# Patient Record
Sex: Male | Born: 1985 | Race: Black or African American | Hispanic: No | Marital: Single | Smoking: Never smoker
Health system: Southern US, Community
[De-identification: ages and names within clinical notes are randomized; demographics above are authoritative.]

## PROBLEM LIST (undated history)

## (undated) DIAGNOSIS — 419620001 Death: Secondary | SNOMED CT

## (undated) DEATH — deceased

---
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
## 2006-02-18 ENCOUNTER — Emergency Department (HOSPITAL_COMMUNITY): Admission: EM | Admit: 2006-02-18 | Discharge: 2006-02-19 | Payer: Self-pay | Admitting: Emergency Medicine

## 2006-02-19 ENCOUNTER — Emergency Department (HOSPITAL_COMMUNITY): Admission: EM | Admit: 2006-02-19 | Discharge: 2006-02-19 | Payer: Self-pay | Admitting: Family Medicine

## 2006-11-02 ENCOUNTER — Emergency Department (HOSPITAL_COMMUNITY): Admission: EM | Admit: 2006-11-02 | Discharge: 2006-11-02 | Payer: Self-pay | Admitting: Family Medicine

## 2007-09-19 ENCOUNTER — Emergency Department (HOSPITAL_COMMUNITY): Admission: EM | Admit: 2007-09-19 | Discharge: 2007-09-19 | Payer: Self-pay | Admitting: Emergency Medicine

## 2007-09-21 ENCOUNTER — Emergency Department (HOSPITAL_COMMUNITY): Admission: EM | Admit: 2007-09-21 | Discharge: 2007-09-21 | Payer: Self-pay | Admitting: Emergency Medicine

## 2007-09-23 ENCOUNTER — Encounter (INDEPENDENT_AMBULATORY_CARE_PROVIDER_SITE_OTHER): Payer: Self-pay | Admitting: Nurse Practitioner

## 2007-09-23 ENCOUNTER — Emergency Department (HOSPITAL_COMMUNITY): Admission: EM | Admit: 2007-09-23 | Discharge: 2007-09-23 | Payer: Self-pay | Admitting: Emergency Medicine

## 2007-09-28 ENCOUNTER — Ambulatory Visit: Payer: Self-pay | Admitting: Nurse Practitioner

## 2007-09-28 DIAGNOSIS — T22219A Burn of second degree of unspecified forearm, initial encounter: Secondary | ICD-10-CM | POA: Insufficient documentation

## 2007-10-01 ENCOUNTER — Ambulatory Visit: Payer: Self-pay | Admitting: Nurse Practitioner

## 2008-02-22 ENCOUNTER — Emergency Department (HOSPITAL_COMMUNITY): Admission: EM | Admit: 2008-02-22 | Discharge: 2008-02-22 | Payer: Self-pay | Admitting: Family Medicine

## 2009-05-06 ENCOUNTER — Emergency Department (HOSPITAL_COMMUNITY): Admission: EM | Admit: 2009-05-06 | Discharge: 2009-05-06 | Payer: Self-pay | Admitting: Emergency Medicine

## 2010-02-24 ENCOUNTER — Emergency Department (HOSPITAL_COMMUNITY): Admission: EM | Admit: 2010-02-24 | Discharge: 2010-02-24 | Payer: Self-pay | Admitting: Emergency Medicine

## 2013-04-26 ENCOUNTER — Emergency Department (HOSPITAL_COMMUNITY)
Admission: EM | Admit: 2013-04-26 | Discharge: 2013-04-26 | Disposition: A | Discharge status: Home / Self Care | Payer: Self-pay | Attending: Emergency Medicine | Admitting: Emergency Medicine

## 2013-04-26 ENCOUNTER — Encounter (HOSPITAL_COMMUNITY): Payer: Self-pay | Admitting: Nurse Practitioner

## 2013-04-26 DIAGNOSIS — M436 Torticollis: Secondary | ICD-10-CM | POA: Insufficient documentation

## 2013-04-26 MED ORDER — DIAZEPAM 5 MG PO TABS
5.0000 mg | ORAL_TABLET | Freq: Once | ORAL | Status: AC
  Administered 2013-04-26: 5 mg via ORAL
  Filled 2013-04-26: qty 1

## 2013-04-26 MED ORDER — IBUPROFEN 600 MG PO TABS: 600.0000 mg | ORAL_TABLET | Freq: Three times a day (TID) | ORAL | Status: DC

## 2013-04-26 MED ORDER — DIAZEPAM 5 MG PO TABS: 5.0000 mg | ORAL_TABLET | Freq: Two times a day (BID) | ORAL | Status: DC

## 2013-04-26 MED ORDER — TRAMADOL HCL 50 MG PO TABS: 50.0000 mg | ORAL_TABLET | Freq: Three times a day (TID) | ORAL | Status: DC | PRN

## 2013-04-26 NOTE — ED Notes (Signed)
Pt states he woke this am "with a  crick in my neck." states " i think i slept wrong." took ibuprofen with no relief. Denies any recent injuries. A&ox4, ambulatory

## 2013-04-26 NOTE — ED Provider Notes (Signed)
History  This chart was scribed for Shawn Munch, MD by Ardeen Jourdain, ED Scribe. This patient was seen in room TR11C/TR11C and the patient's care was started at 2028.  CSN: [id number]  Arrival date & time 04/26/13  1743   None     Chief Complaint  Patient presents with  . Neck Pain    The history is provided by the patient. No language interpreter was used.    CHANE COWDEN is a 27 y.o. male who presents to the Emergency Department complaining of gradual onset, gradually worsening, constant left neck pain that began this morning when he woke up. He states "I think I slept wrong." He states the pain is aggravated by moving his head. He denies any recent injuries or trauma to the area. He reports taking ibuprofen with no relief. He denies any fever, chills, SOB, difficulty swallowing, HA and CP as associated symptoms.    No past medical history on file.  No past surgical history on file.  No family history on file.  History  Substance Use Topics  . Smoking status: Never Smoker   . Smokeless tobacco: Not on file  . Alcohol Use: Yes      Review of Systems  Constitutional:       Per HPI, otherwise negative  HENT:       Per HPI, otherwise negative  Respiratory:       Per HPI, otherwise negative  Cardiovascular:       Per HPI, otherwise negative  Gastrointestinal: Negative for vomiting.  Endocrine:       Negative aside from HPI  Genitourinary:       Neg aside from HPI   Musculoskeletal:       Per HPI, otherwise negative  Skin: Negative.   Neurological: Negative for syncope.  All other systems reviewed and are negative.    Allergies  Review of patient's allergies indicates no known allergies.  Home Medications  No current outpatient prescriptions on file.  Triage Vitals: BP 144/85  Pulse 79  Temp(Src) 98.8 F (37.1 C) (Oral)  Resp 16  SpO2 100%  Physical Exam  Nursing note and vitals reviewed. Constitutional: He is oriented to person, place, and  time. He appears well-developed. No distress.  HENT:  Head: Normocephalic and atraumatic.  Eyes: Conjunctivae and EOM are normal.  Neck: Neck supple. No tracheal deviation present. No thyromegaly present.  TTP to left paraspinal muscles at the base of the neck  Cardiovascular: Normal rate, regular rhythm and normal heart sounds.  Exam reveals no gallop and no friction rub.   No murmur heard. Pulmonary/Chest: Effort normal and breath sounds normal. No stridor. No respiratory distress. He has no wheezes. He has no rales. He exhibits no tenderness.  Abdominal: He exhibits no distension.  Musculoskeletal: He exhibits no edema.  Lymphadenopathy:    He has no cervical adenopathy.  Neurological: He is alert and oriented to person, place, and time.  Skin: Skin is warm and dry.  Psychiatric: He has a normal mood and affect.    ED Course  Procedures (including critical care time)  8:33 PM-Discussed treatment plan which includes antiinflammatory medications, muscle relaxants and advise for home care with pt at bedside and pt agreed to plan.    Labs Reviewed - No data to display No results found.   No diagnosis found.    MDM   I personally performed the services described in this documentation, which was scribed in my presence. The recorded information has  been reviewed and is accurate.   Patient presents with the acute onset of neck pain 2 mornings ago.  On exam he is neurologically intact, and in no distress.  Given the atraumatic history, absence of distress, the patient was treated for torticollis discharge with return precautions, follow instructions   Shawn Munch, MD 04/26/13 2038

## 2013-06-22 ENCOUNTER — Encounter (HOSPITAL_COMMUNITY): Payer: Self-pay | Admitting: Emergency Medicine

## 2013-06-22 ENCOUNTER — Emergency Department (HOSPITAL_COMMUNITY)
Admission: EM | Admit: 2013-06-22 | Discharge: 2013-06-22 | Disposition: A | Discharge status: Home / Self Care | Payer: Self-pay | Attending: Emergency Medicine | Admitting: Emergency Medicine

## 2013-06-22 ENCOUNTER — Emergency Department (HOSPITAL_COMMUNITY): Payer: Self-pay

## 2013-06-22 DIAGNOSIS — R071 Chest pain on breathing: Secondary | ICD-10-CM | POA: Insufficient documentation

## 2013-06-22 DIAGNOSIS — R0789 Other chest pain: Secondary | ICD-10-CM

## 2013-06-22 MED ORDER — SIMETHICONE 40 MG/0.6ML PO SUSP (UNIT DOSE)
40.0000 mg | Freq: Once | ORAL | Status: AC
  Administered 2013-06-22: 40 mg via ORAL
  Filled 2013-06-22: qty 0.6

## 2013-06-22 MED ORDER — IBUPROFEN 800 MG PO TABS
800.0000 mg | ORAL_TABLET | Freq: Once | ORAL | Status: AC
  Administered 2013-06-22: 800 mg via ORAL
  Filled 2013-06-22: qty 1

## 2013-06-22 MED ORDER — DIAZEPAM 5 MG PO TABS
5.0000 mg | ORAL_TABLET | Freq: Once | ORAL | Status: AC
  Administered 2013-06-22: 5 mg via ORAL
  Filled 2013-06-22: qty 1

## 2013-06-22 MED ORDER — DIAZEPAM 5 MG PO TABS: 5.0000 mg | ORAL_TABLET | Freq: Two times a day (BID) | ORAL | Status: DC

## 2013-06-22 MED ORDER — IBUPROFEN 800 MG PO TABS: 800.0000 mg | ORAL_TABLET | Freq: Three times a day (TID) | ORAL | Status: DC

## 2013-06-22 NOTE — ED Provider Notes (Signed)
Medical screening examination/treatment/procedure(s) were performed by non-physician practitioner and as supervising physician I was immediately available for consultation/collaboration.  Azel Gumina, MD 06/22/13 1326 

## 2013-06-22 NOTE — ED Notes (Signed)
Pt reports chest pain that feels exactly like when he was seen here previously.

## 2013-06-22 NOTE — ED Notes (Signed)
Tiffany PA-c at bedside

## 2013-06-22 NOTE — ED Provider Notes (Signed)
History    CSN: [id number] Arrival date & time 06/22/13  1047  First MD Initiated Contact with Patient 06/22/13 1120     Chief Complaint  Patient presents with  . Pleurisy   (Consider location/radiation/quality/duration/timing/severity/associated sxs/prior Treatment) HPI  Shawn Herrera is a 27 y.o.male  presents to the ER with complaints of chest pain. He was out doing yard work this morning when he developed sharp substernal pain that lasted approximately an hour. He did not take anything prior to arrival. He is no longer hurting. He says that he has had pleurosiy before and that this feels exactly the same. Has not had any URI symptoms of coughing, fevers, vomiting, diaphoresis. He ate pancakes this morning for breakfast and denies anything spicy or acidic. He is healthy at baseline without any chronic issues and is very physically active.   No past medical history on file. No past surgical history on file. No family history on file. History  Substance Use Topics  . Smoking status: Never Smoker   . Smokeless tobacco: Not on file  . Alcohol Use: Yes    Review of Systems  Cardiovascular: Positive for chest pain.  All other systems reviewed and are negative.    Allergies  Review of patient's allergies indicates no known allergies.  Home Medications   Current Outpatient Rx  Name  Route  Sig  Dispense  Refill  . diazepam (VALIUM) 5 MG tablet   Oral   Take 1 tablet (5 mg total) by mouth 2 (two) times daily.   10 tablet   0   . ibuprofen (ADVIL,MOTRIN) 800 MG tablet   Oral   Take 1 tablet (800 mg total) by mouth 3 (three) times daily.   21 tablet   0    BP 131/80  Pulse 65  Resp 16  SpO2 100% Physical Exam  Nursing note and vitals reviewed. Constitutional: He appears well-developed and well-nourished. No distress.  HENT:  Head: Normocephalic and atraumatic.  Eyes: Pupils are equal, round, and reactive to light.  Neck: Normal range of motion. Neck supple.   Cardiovascular: Normal rate and regular rhythm.   Pulmonary/Chest: Effort normal. He exhibits no mass, no tenderness, no laceration, no crepitus, no deformity, no swelling and no retraction.  Abdominal: Soft.  Neurological: He is alert.  Skin: Skin is warm and dry.    ED Course  Procedures (including critical care time) Labs Reviewed - No data to display Dg Chest 2 View  06/22/2013   *RADIOLOGY REPORT*  Clinical Data: Chest pain  CHEST - 2 VIEW  Comparison: February 24, 2010  Findings:  Lungs clear.  Heart size and pulmonary vascularity are normal.  No adenopathy.  No bone lesions.  There is mild upper thoracic levoscoliosis. No pneumothorax.  IMPRESSION: No edema or consolidation.  Mild scoliosis.   Original Report Authenticated By: Bretta Bang, M.D.   1. Chest wall pain     MDM   Date: 06/22/2013  Rate: 72  Rhythm:  sinus rhythm  QRS Axis: normal  Intervals: normal  ST/T Wave abnormalities: normal  Conduction Disutrbances:none  Narrative Interpretation:   Old EKG Reviewed: non available   Chest xray and EKG are unremarkable. Discussed case with Dr. Judd Lien.  Patient given PO ibuprofen, Valium and Simethicone. Says he feels completely better. Pt is PERC negative for PE. May be muscular and anxiety.   Rx: Valium and Ibuprofen.   27 y.o.Shawn Herrera's evaluation in the Emergency Department is complete. It has been  determined that no acute conditions requiring further emergency intervention are present at this time. The patient/guardian have been advised of the diagnosis and plan. We have discussed signs and symptoms that warrant return to the ED, such as changes or worsening in symptoms.  Vital signs are stable at discharge. Filed Vitals:   06/22/13 1245  BP: 131/80  Pulse: 65  Resp: 16    Patient/guardian has voiced understanding and agreed to follow-up with the PCP or specialist.      Dorthula Matas, PA-C 06/22/13 1258

## 2013-06-22 NOTE — ED Notes (Addendum)
PT was outside building a table; chest pain starting suddenly worsening with movement and deep breaths; has had this pain before and been seen here several times for pluerisy. Denies N,V, diaphoresis.

## 2013-06-22 NOTE — ED Notes (Signed)
Pharmacy notified about mylicon

## 2014-01-30 ENCOUNTER — Encounter (HOSPITAL_COMMUNITY): Payer: Self-pay | Admitting: Emergency Medicine

## 2014-01-30 ENCOUNTER — Emergency Department (HOSPITAL_COMMUNITY)
Admission: EM | Admit: 2014-01-30 | Discharge: 2014-01-30 | Disposition: A | Discharge status: Home / Self Care | Payer: Self-pay | Attending: Emergency Medicine | Admitting: Emergency Medicine

## 2014-01-30 DIAGNOSIS — R197 Diarrhea, unspecified: Secondary | ICD-10-CM | POA: Insufficient documentation

## 2014-01-30 DIAGNOSIS — Z791 Long term (current) use of non-steroidal anti-inflammatories (NSAID): Secondary | ICD-10-CM | POA: Insufficient documentation

## 2014-01-30 DIAGNOSIS — R112 Nausea with vomiting, unspecified: Secondary | ICD-10-CM | POA: Insufficient documentation

## 2014-01-30 DIAGNOSIS — D72829 Elevated white blood cell count, unspecified: Secondary | ICD-10-CM | POA: Insufficient documentation

## 2014-01-30 DIAGNOSIS — Z79899 Other long term (current) drug therapy: Secondary | ICD-10-CM | POA: Insufficient documentation

## 2014-01-30 LAB — CBC WITH DIFFERENTIAL/PLATELET
Basophils Absolute: 0 10*3/uL (ref 0.0–0.1)
Basophils Relative: 0 % (ref 0–1)
EOS PCT: 0 % (ref 0–5)
Eosinophils Absolute: 0 10*3/uL (ref 0.0–0.7)
HEMATOCRIT: 45.4 % (ref 39.0–52.0)
Hemoglobin: 15.3 g/dL (ref 13.0–17.0)
LYMPHS ABS: 0.6 10*3/uL — AB (ref 0.7–4.0)
LYMPHS PCT: 5 % — AB (ref 12–46)
MCH: 27.2 pg (ref 26.0–34.0)
MCHC: 33.7 g/dL (ref 30.0–36.0)
MCV: 80.8 fL (ref 78.0–100.0)
Monocytes Absolute: 0.5 10*3/uL (ref 0.1–1.0)
Monocytes Relative: 4 % (ref 3–12)
NEUTROS ABS: 11.2 10*3/uL — AB (ref 1.7–7.7)
Neutrophils Relative %: 91 % — ABNORMAL HIGH (ref 43–77)
PLATELETS: 141 10*3/uL — AB (ref 150–400)
RBC: 5.62 MIL/uL (ref 4.22–5.81)
RDW: 12.4 % (ref 11.5–15.5)
WBC: 12.3 10*3/uL — AB (ref 4.0–10.5)

## 2014-01-30 LAB — COMPREHENSIVE METABOLIC PANEL
ALK PHOS: 54 U/L (ref 39–117)
ALT: 16 U/L (ref 0–53)
AST: 20 U/L (ref 0–37)
Albumin: 4.3 g/dL (ref 3.5–5.2)
BILIRUBIN TOTAL: 1.3 mg/dL — AB (ref 0.3–1.2)
BUN: 21 mg/dL (ref 6–23)
CALCIUM: 9.2 mg/dL (ref 8.4–10.5)
CHLORIDE: 98 meq/L (ref 96–112)
CO2: 26 meq/L (ref 19–32)
Creatinine, Ser: 1.21 mg/dL (ref 0.50–1.35)
GFR, EST NON AFRICAN AMERICAN: 81 mL/min — AB (ref >90)
GLUCOSE: 113 mg/dL — AB (ref 70–99)
Potassium: 4 mEq/L (ref 3.7–5.3)
SODIUM: 140 meq/L (ref 137–147)
Total Protein: 8.1 g/dL (ref 6.0–8.3)

## 2014-01-30 LAB — LIPASE, BLOOD: Lipase: 13 U/L (ref 11–59)

## 2014-01-30 MED ORDER — ONDANSETRON HCL 4 MG/2ML IJ SOLN
4.0000 mg | Freq: Once | INTRAMUSCULAR | Status: AC
  Administered 2014-01-30: 4 mg via INTRAVENOUS
  Filled 2014-01-30: qty 2

## 2014-01-30 MED ORDER — SODIUM CHLORIDE 0.9 % IV BOLUS (SEPSIS)
1000.0000 mL | Freq: Once | INTRAVENOUS | Status: AC
  Administered 2014-01-30: 1000 mL via INTRAVENOUS

## 2014-01-30 MED ORDER — ONDANSETRON 4 MG PO TBDP: 4.0000 mg | ORAL_TABLET | Freq: Three times a day (TID) | ORAL | Status: DC | PRN

## 2014-01-30 NOTE — ED Notes (Signed)
C/o n/v ever since eating a roast beef sandwich last night. No bowel/bladder changes

## 2014-01-30 NOTE — Discharge Instructions (Signed)
Take the prescribed medication as directed. Continue drinking plenty of fluids to keep yourself hydrated.  May wish to start with bland diet and progress as tolerated. Return to the ED for new or worsening symptoms.

## 2014-01-30 NOTE — ED Provider Notes (Signed)
CSN: [id number]     Arrival date & time 01/30/14  1009 History   First MD Initiated Contact with Patient 01/30/14 1153     Chief Complaint  Patient presents with  . Emesis   (Consider location/radiation/quality/duration/timing/severity/associated sxs/prior Treatment) The history is provided by the patient and medical records.   This is a 28 year old male with no significant past medical history presenting to the ED for nausea, vomiting, and diarrhea since eating every speech sandwich at [employer] last night. Pt has had multiple episodes of non-bloody, non-bilious emesis throughout the night last night.  Watery, non-bloody diarrhea started this morning.  Patient states he ate there with his mother who ate the same meal-- she is sick today with similar symptoms.  Has tried drinking water this morning but unable to hold anything down.  He denies any fevers, sweats, or chills. He denies any abdominal pain but states his stomach feels like "it is rolling around".  Denies and urinary sx.  No prior abdominal surgeries.  Pt tachycardic on arrival, VS otherwise stable.  History reviewed. No pertinent past medical history. History reviewed. No pertinent past surgical history. History reviewed. No pertinent family history. History  Substance Use Topics  . Smoking status: Never Smoker   . Smokeless tobacco: Not on file  . Alcohol Use: Yes    Review of Systems  Gastrointestinal: Positive for nausea, vomiting and diarrhea.  All other systems reviewed and are negative.    Allergies  Review of patient's allergies indicates no known allergies.  Home Medications   Current Outpatient Rx  Name  Route  Sig  Dispense  Refill  . diazepam (VALIUM) 5 MG tablet   Oral   Take 1 tablet (5 mg total) by mouth 2 (two) times daily.   10 tablet   0   . ibuprofen (ADVIL,MOTRIN) 800 MG tablet   Oral   Take 1 tablet (800 mg total) by mouth 3 (three) times daily.   21 tablet   0    BP 126/73  Pulse 112   Temp(Src) 99.9 F (37.7 C) (Oral)  Resp 16  Ht 6\' 1"  (1.854 m)  Wt 164 lb (74.39 kg)  BMI 21.64 kg/m2  SpO2 100%  Physical Exam  Nursing note and vitals reviewed. Constitutional: He is oriented to person, place, and time. He appears well-developed and well-nourished. No distress.  HENT:  Head: Normocephalic and atraumatic.  Mouth/Throat: Oropharynx is clear and moist.  Mildly dry mucous membranes  Eyes: Conjunctivae and EOM are normal. Pupils are equal, round, and reactive to light.  Neck: Normal range of motion. Neck supple.  Cardiovascular: Normal rate, regular rhythm and normal heart sounds.   Pulmonary/Chest: Effort normal and breath sounds normal. No respiratory distress. He has no wheezes.  Abdominal: Soft. Bowel sounds are normal. There is no tenderness. There is no guarding.  Abdomen soft, nondistended, no peritoneal signs  Musculoskeletal: Normal range of motion.  Neurological: He is alert and oriented to person, place, and time.  Skin: Skin is warm and dry. He is not diaphoretic.  Psychiatric: He has a normal mood and affect.    ED Course  Procedures (including critical care time) Labs Review Labs Reviewed  CBC WITH DIFFERENTIAL - Abnormal; Notable for the following:    WBC 12.3 (*)    Platelets 141 (*)    Neutrophils Relative % 91 (*)    Neutro Abs 11.2 (*)    Lymphocytes Relative 5 (*)    Lymphs Abs 0.6 (*)  All other components within normal limits  COMPREHENSIVE METABOLIC PANEL - Abnormal; Notable for the following:    Glucose, Bld 113 (*)    Total Bilirubin 1.3 (*)    GFR calc non Af Amer 81 (*)    All other components within normal limits  LIPASE, BLOOD   Imaging Review No results found.  EKG Interpretation   None       MDM   1. Nausea vomiting and diarrhea    Labs as above-- mild leukocytosis at 12.3, no electrolyte imbalance.  Pt given IVFB and zofran with good improvement of his sx and tachycardia.  Pt remains afebrile and non-toxic  appearing, no active vomiting while in the ED, has tolerated PO without difficulty.  Sx possibly due to mild food poisoning vs gastroenteritis.  Will be discharged with zofran.  Advised to continue drinking plenty of fluids to keep himself hydrated. May wish to start with bland diet and progress as tolerated. Discussed plan with patient on heat knowledge understanding and agreed. Return precautions advised her and her worsening symptoms.  Garlon HatchetLisa M Dekota Shenk, PA-C 01/30/14 1433

## 2014-02-01 NOTE — ED Provider Notes (Signed)
Medical screening examination/treatment/procedure(s) were performed by non-physician practitioner and as supervising physician I was immediately available for consultation/collaboration.    Celene KrasJon R Jury Caserta, MD 02/01/14 [id number]

## 2014-11-23 ENCOUNTER — Other Ambulatory Visit: Payer: Self-pay

## 2014-11-23 ENCOUNTER — Emergency Department (HOSPITAL_COMMUNITY): Payer: Self-pay

## 2014-11-23 ENCOUNTER — Encounter (HOSPITAL_COMMUNITY): Payer: Self-pay | Admitting: Adult Health

## 2014-11-23 ENCOUNTER — Emergency Department (HOSPITAL_COMMUNITY)
Admission: EM | Admit: 2014-11-23 | Discharge: 2014-11-23 | Disposition: A | Discharge status: Home / Self Care | Payer: Self-pay | Attending: Emergency Medicine | Admitting: Emergency Medicine

## 2014-11-23 DIAGNOSIS — Y9289 Other specified places as the place of occurrence of the external cause: Secondary | ICD-10-CM | POA: Insufficient documentation

## 2014-11-23 DIAGNOSIS — Y9389 Activity, other specified: Secondary | ICD-10-CM | POA: Insufficient documentation

## 2014-11-23 DIAGNOSIS — R0789 Other chest pain: Secondary | ICD-10-CM | POA: Insufficient documentation

## 2014-11-23 DIAGNOSIS — Y990 Civilian activity done for income or pay: Secondary | ICD-10-CM | POA: Insufficient documentation

## 2014-11-23 DIAGNOSIS — J680 Bronchitis and pneumonitis due to chemicals, gases, fumes and vapors: Secondary | ICD-10-CM | POA: Insufficient documentation

## 2014-11-23 DIAGNOSIS — R079 Chest pain, unspecified: Secondary | ICD-10-CM

## 2014-11-23 DIAGNOSIS — T578X1A Toxic effect of other specified inorganic substances, accidental (unintentional), initial encounter: Secondary | ICD-10-CM | POA: Insufficient documentation

## 2014-11-23 LAB — CBC
HCT: 43.3 % (ref 39.0–52.0)
Hemoglobin: 14.4 g/dL (ref 13.0–17.0)
MCH: 27.2 pg (ref 26.0–34.0)
MCHC: 33.3 g/dL (ref 30.0–36.0)
MCV: 81.7 fL (ref 78.0–100.0)
PLATELETS: 154 10*3/uL (ref 150–400)
RBC: 5.3 MIL/uL (ref 4.22–5.81)
RDW: 12.4 % (ref 11.5–15.5)
WBC: 9.2 10*3/uL (ref 4.0–10.5)

## 2014-11-23 LAB — I-STAT TROPONIN, ED: TROPONIN I, POC: 0 ng/mL (ref 0.00–0.08)

## 2014-11-23 MED ORDER — IBUPROFEN 800 MG PO TABS
800.0000 mg | ORAL_TABLET | Freq: Once | ORAL | Status: AC
  Administered 2014-11-23: 800 mg via ORAL
  Filled 2014-11-23: qty 1
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
## 2014-11-23 NOTE — Discharge Instructions (Signed)
Acute Bronchitis Bronchitis is when the airways that extend from the windpipe into the lungs get red, puffy, and painful (inflamed). Bronchitis often causes thick spit (mucus) to develop. This leads to a cough. A cough is the most common symptom of bronchitis. In acute bronchitis, the condition usually begins suddenly and goes away over time (usually in 2 weeks). Smoking, allergies, and asthma can make bronchitis worse. Repeated episodes of bronchitis may cause more lung problems. HOME CARE  Rest.  Drink enough fluids to keep your pee (urine) clear or pale yellow (unless you need to limit fluids as told by your doctor).  Only take over-the-counter or prescription medicines as told by your doctor.  Avoid smoking and secondhand smoke. These can make bronchitis worse. If you are a smoker, think about using nicotine gum or skin patches. Quitting smoking will help your lungs heal faster.  Reduce the chance of getting bronchitis again by:  Washing your hands often.  Avoiding people with cold symptoms.  Trying not to touch your hands to your mouth, nose, or eyes.  Follow up with your doctor as told. GET HELP IF: Your symptoms do not improve after 1 week of treatment. Symptoms include:  Cough.  Fever.  Coughing up thick spit.  Body aches.  Chest congestion.  Chills.  Shortness of breath.  Sore throat. GET HELP RIGHT AWAY IF:   You have an increased fever.  You have chills.  You have severe shortness of breath.  You have bloody thick spit (sputum).  You throw up (vomit) often.  You lose too much body fluid (dehydration).  You have a severe headache.  You faint. MAKE SURE YOU:   Understand these instructions.  Will watch your condition.  Will get help right away if you are not doing well or get worse. Document Released: 05/27/2008 Document Revised: 08/11/2013 Document Reviewed: 06/01/2013 Exit[employer] Patient Information 2015 [employer]. This information is not  intended to replace advice given to you by your health care provider. Make sure you discuss any questions you have with your health care provider.  

## 2014-12-23 HISTORY — DX: Death: 419620001

## 2016-05-08 ENCOUNTER — Emergency Department (HOSPITAL_COMMUNITY)
Admission: EM | Admit: 2016-05-08 | Discharge: 2016-05-09 | Disposition: A | Discharge status: Home / Self Care | Payer: Self-pay | Attending: Emergency Medicine | Admitting: Emergency Medicine

## 2016-05-08 ENCOUNTER — Encounter (HOSPITAL_COMMUNITY): Payer: Self-pay | Admitting: Emergency Medicine

## 2016-05-08 DIAGNOSIS — K0889 Other specified disorders of teeth and supporting structures: Secondary | ICD-10-CM | POA: Insufficient documentation

## 2016-05-08 DIAGNOSIS — R110 Nausea: Secondary | ICD-10-CM | POA: Insufficient documentation

## 2016-05-08 NOTE — ED Notes (Signed)
Pt. reports right upper molar pain onset last week unrelieved by OTC pain medication .

## 2016-05-09 MED ORDER — IBUPROFEN 400 MG PO TABS
600.0000 mg | ORAL_TABLET | Freq: Once | ORAL | Status: AC
  Administered 2016-05-09: 600 mg via ORAL
  Filled 2016-05-09: qty 1

## 2016-05-09 MED ORDER — PENICILLIN V POTASSIUM 250 MG PO TABS
500.0000 mg | ORAL_TABLET | Freq: Once | ORAL | Status: AC
  Administered 2016-05-09: 500 mg via ORAL
  Filled 2016-05-09: qty 2

## 2016-05-09 MED ORDER — PENICILLIN V POTASSIUM 500 MG PO TABS: 500.0000 mg | ORAL_TABLET | Freq: Four times a day (QID) | ORAL | Status: AC

## 2016-05-09 MED ORDER — IBUPROFEN 600 MG PO TABS: 600.0000 mg | ORAL_TABLET | Freq: Three times a day (TID) | ORAL | Status: DC | PRN
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
## 2016-12-11 ENCOUNTER — Emergency Department (HOSPITAL_COMMUNITY)
Admission: EM | Admit: 2016-12-11 | Discharge: 2016-12-12 | Disposition: A | Discharge status: Home / Self Care | Payer: Self-pay | Attending: Emergency Medicine | Admitting: Emergency Medicine

## 2016-12-11 ENCOUNTER — Encounter (HOSPITAL_COMMUNITY): Payer: Self-pay | Admitting: Vascular Surgery

## 2016-12-11 DIAGNOSIS — Z202 Contact with and (suspected) exposure to infections with a predominantly sexual mode of transmission: Secondary | ICD-10-CM | POA: Insufficient documentation

## 2016-12-11 DIAGNOSIS — R369 Urethral discharge, unspecified: Secondary | ICD-10-CM | POA: Insufficient documentation

## 2016-12-11 DIAGNOSIS — Z711 Person with feared health complaint in whom no diagnosis is made: Secondary | ICD-10-CM

## 2016-12-11 MED ORDER — CEFTRIAXONE SODIUM 250 MG IJ SOLR
250.0000 mg | Freq: Once | INTRAMUSCULAR | Status: AC
  Administered 2016-12-11: 250 mg via INTRAMUSCULAR
  Filled 2016-12-11: qty 250

## 2016-12-11 MED ORDER — AZITHROMYCIN 250 MG PO TABS
1000.0000 mg | ORAL_TABLET | Freq: Once | ORAL | Status: AC
  Administered 2016-12-11: 1000 mg via ORAL
  Filled 2016-12-11: qty 4

## 2016-12-11 NOTE — ED Triage Notes (Signed)
Pt reports to the ED for eval of dysuria and penile discharge that began today. Pt reports recent unprotected sexual intercourse. Denies any abd pain, fever, chills, or N/V/D.
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
## 2016-12-12 LAB — GC/CHLAMYDIA PROBE AMP (~~LOC~~) NOT AT ARMC
Chlamydia: POSITIVE — AB
Neisseria Gonorrhea: POSITIVE — AB

## 2016-12-12 NOTE — ED Notes (Signed)
E-signature not working. Pt understands discharge instructions. 

## 2016-12-18 ENCOUNTER — Encounter (HOSPITAL_COMMUNITY): Payer: Self-pay | Admitting: Emergency Medicine

## 2017-02-10 ENCOUNTER — Encounter (HOSPITAL_COMMUNITY): Payer: Self-pay | Admitting: Emergency Medicine

## 2017-02-10 ENCOUNTER — Emergency Department (HOSPITAL_COMMUNITY)
Admission: EM | Admit: 2017-02-10 | Discharge: 2017-02-10 | Disposition: A | Discharge status: Home / Self Care | Payer: Self-pay | Attending: Emergency Medicine | Admitting: Emergency Medicine

## 2017-02-10 ENCOUNTER — Emergency Department (HOSPITAL_COMMUNITY): Payer: Self-pay

## 2017-02-10 DIAGNOSIS — R0789 Other chest pain: Secondary | ICD-10-CM | POA: Insufficient documentation

## 2017-02-10 DIAGNOSIS — N289 Disorder of kidney and ureter, unspecified: Secondary | ICD-10-CM | POA: Insufficient documentation

## 2017-02-10 LAB — I-STAT CHEM 8, ED
BUN: 11 mg/dL (ref 6–20)
CALCIUM ION: 1.17 mmol/L (ref 1.15–1.40)
CHLORIDE: 101 mmol/L (ref 101–111)
Creatinine, Ser: 1.4 mg/dL — ABNORMAL HIGH (ref 0.61–1.24)
Glucose, Bld: 108 mg/dL — ABNORMAL HIGH (ref 65–99)
HCT: 44 % (ref 39.0–52.0)
HEMOGLOBIN: 15 g/dL (ref 13.0–17.0)
POTASSIUM: 4.1 mmol/L (ref 3.5–5.1)
SODIUM: 141 mmol/L (ref 135–145)
TCO2: 30 mmol/L (ref 0–100)

## 2017-02-10 LAB — I-STAT TROPONIN, ED
TROPONIN I, POC: 0 ng/mL (ref 0.00–0.08)
TROPONIN I, POC: 0 ng/mL (ref 0.00–0.08)

## 2017-02-10 MED ORDER — IBUPROFEN 200 MG PO TABS
600.0000 mg | ORAL_TABLET | Freq: Once | ORAL | Status: AC
  Administered 2017-02-10: 18:00:00 600 mg via ORAL
  Filled 2017-02-10: qty 1

## 2017-02-10 NOTE — ED Notes (Signed)
Patient transported to X-ray 

## 2017-02-10 NOTE — ED Notes (Signed)
Care handoff to Crystal, RN 

## 2017-02-10 NOTE — ED Notes (Signed)
PT reports pain has increased to 6/10. PT requests pain medicine. Dr. Rennis ChrisJacobowitz made aware.

## 2017-02-10 NOTE — ED Notes (Signed)
Pt unable to sign. No computer. Pt verbalizes understanding of discharge instructions.

## 2017-02-10 NOTE — Discharge Instructions (Signed)
Call the number on these instructions to get a list of primary care physicians. Your blood creatinine which is a measure of your kidney function is slightly off at 1.4 today. You should get the test recheck within a month. Take Tylenol for pain. Avoid medications such as Motrin, Advil, Naprosyn or the class of medications known as NSAIDS as they can interfere with kidney function. Return if concern for any reason
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
## 2017-02-11 ENCOUNTER — Emergency Department (HOSPITAL_COMMUNITY)
Admission: EM | Admit: 2017-02-11 | Discharge: 2017-02-11 | Disposition: A | Discharge status: Home / Self Care | Payer: Self-pay | Attending: Emergency Medicine | Admitting: Emergency Medicine

## 2017-02-11 ENCOUNTER — Encounter (HOSPITAL_COMMUNITY): Payer: Self-pay | Admitting: *Deleted

## 2017-02-11 DIAGNOSIS — R1013 Epigastric pain: Secondary | ICD-10-CM | POA: Insufficient documentation

## 2017-02-11 LAB — BASIC METABOLIC PANEL
ANION GAP: 10 (ref 5–15)
BUN: 10 mg/dL (ref 6–20)
CHLORIDE: 100 mmol/L — AB (ref 101–111)
CO2: 29 mmol/L (ref 22–32)
Calcium: 9.6 mg/dL (ref 8.9–10.3)
Creatinine, Ser: 1.24 mg/dL (ref 0.61–1.24)
GFR calc Af Amer: >60 mL/min (ref >60)
GFR calc non Af Amer: >60 mL/min (ref >60)
GLUCOSE: 113 mg/dL — AB (ref 65–99)
POTASSIUM: 4 mmol/L (ref 3.5–5.1)
Sodium: 139 mmol/L (ref 135–145)

## 2017-02-11 LAB — CBC
HEMATOCRIT: 46.2 % (ref 39.0–52.0)
HEMOGLOBIN: 15.3 g/dL (ref 13.0–17.0)
MCH: 26.6 pg (ref 26.0–34.0)
MCHC: 33.1 g/dL (ref 30.0–36.0)
MCV: 80.2 fL (ref 78.0–100.0)
Platelets: 176 10*3/uL (ref 150–400)
RBC: 5.76 MIL/uL (ref 4.22–5.81)
RDW: 12.3 % (ref 11.5–15.5)
WBC: 6.2 10*3/uL (ref 4.0–10.5)

## 2017-02-11 LAB — I-STAT TROPONIN, ED: Troponin i, poc: 0 ng/mL (ref 0.00–0.08)

## 2017-02-11 MED ORDER — RANITIDINE HCL 150 MG PO TABS: 150.0000 mg | ORAL_TABLET | Freq: Two times a day (BID) | ORAL | 0 refills | Status: DC

## 2017-02-11 NOTE — Discharge Instructions (Signed)
Take the medication as directed. [facility]. Return here as needed.
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
## 2017-02-11 NOTE — ED Triage Notes (Signed)
Pt was seen yesterday for same. reports still having mid chest/epigastric pain that increases when lying down and when trying to have bowel movement. ekg done at triage and no acute distress is noted.

## 2017-02-11 NOTE — ED Notes (Signed)
Pt reports the pain got worse when he laid down last night and after eating.

## 2018-02-03 ENCOUNTER — Encounter (HOSPITAL_COMMUNITY): Payer: Self-pay

## 2018-02-03 ENCOUNTER — Emergency Department (HOSPITAL_COMMUNITY)
Admission: EM | Admit: 2018-02-03 | Discharge: 2018-02-03 | Disposition: A | Discharge status: Home / Self Care | Payer: Self-pay | Attending: Emergency Medicine | Admitting: Emergency Medicine

## 2018-02-03 DIAGNOSIS — B349 Viral infection, unspecified: Secondary | ICD-10-CM

## 2018-02-03 DIAGNOSIS — R52 Pain, unspecified: Secondary | ICD-10-CM

## 2018-02-03 DIAGNOSIS — M7918 Myalgia, other site: Secondary | ICD-10-CM | POA: Insufficient documentation

## 2018-02-03 MED ORDER — IBUPROFEN 600 MG PO TABS: 600.0000 mg | ORAL_TABLET | Freq: Four times a day (QID) | ORAL | 0 refills | Status: DC | PRN

## 2018-02-03 MED ORDER — PROMETHAZINE HCL 25 MG PO TABS: 25.0000 mg | ORAL_TABLET | Freq: Four times a day (QID) | ORAL | 0 refills | Status: DC | PRN

## 2018-02-03 MED ORDER — BENZONATATE 100 MG PO CAPS: 100.0000 mg | ORAL_CAPSULE | Freq: Three times a day (TID) | ORAL | 0 refills | Status: DC | PRN

## 2018-02-03 MED ORDER — IBUPROFEN 800 MG PO TABS
800.0000 mg | ORAL_TABLET | Freq: Once | ORAL | Status: AC
  Administered 2018-02-03: 800 mg via ORAL
  Filled 2018-02-03: qty 1

## 2018-02-03 NOTE — ED Triage Notes (Signed)
Pt complains of general body aches today, he states that everyone in his house is sick and doesn't want to get it, he's unsure if anyone actually has the flu
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
## 2018-05-21 ENCOUNTER — Encounter (HOSPITAL_COMMUNITY): Payer: Self-pay | Admitting: Emergency Medicine

## 2018-05-21 ENCOUNTER — Emergency Department (HOSPITAL_COMMUNITY)
Admission: EM | Admit: 2018-05-21 | Discharge: 2018-05-21 | Disposition: A | Discharge status: Home / Self Care | Payer: Self-pay | Attending: Emergency Medicine | Admitting: Emergency Medicine

## 2018-05-21 ENCOUNTER — Emergency Department (HOSPITAL_COMMUNITY): Payer: Self-pay

## 2018-05-21 ENCOUNTER — Other Ambulatory Visit: Payer: Self-pay

## 2018-05-21 DIAGNOSIS — M25572 Pain in left ankle and joints of left foot: Secondary | ICD-10-CM | POA: Insufficient documentation

## 2018-05-21 DIAGNOSIS — Z5321 Procedure and treatment not carried out due to patient leaving prior to being seen by health care provider: Secondary | ICD-10-CM | POA: Insufficient documentation

## 2018-05-21 DIAGNOSIS — M79641 Pain in right hand: Secondary | ICD-10-CM | POA: Insufficient documentation

## 2018-05-21 DIAGNOSIS — Y929 Unspecified place or not applicable: Secondary | ICD-10-CM | POA: Insufficient documentation

## 2018-05-21 DIAGNOSIS — Y999 Unspecified external cause status: Secondary | ICD-10-CM | POA: Insufficient documentation

## 2018-05-21 DIAGNOSIS — Y9355 Activity, bike riding: Secondary | ICD-10-CM | POA: Insufficient documentation

## 2018-05-21 NOTE — ED Notes (Signed)
Pt notified staff he was leaving.

## 2018-05-21 NOTE — ED Notes (Signed)
Follow up call made  No answer  05/21/18  1133 s Christal Lagerstrom rn
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
## 2018-06-30 ENCOUNTER — Encounter (HOSPITAL_COMMUNITY): Payer: Self-pay

## 2018-06-30 ENCOUNTER — Emergency Department (HOSPITAL_COMMUNITY)
Admission: EM | Admit: 2018-06-30 | Discharge: 2018-06-30 | Disposition: A | Discharge status: Home / Self Care | Payer: Self-pay | Attending: Emergency Medicine | Admitting: Emergency Medicine

## 2018-06-30 ENCOUNTER — Other Ambulatory Visit: Payer: Self-pay

## 2018-06-30 DIAGNOSIS — J028 Acute pharyngitis due to other specified organisms: Secondary | ICD-10-CM | POA: Insufficient documentation

## 2018-06-30 DIAGNOSIS — B9689 Other specified bacterial agents as the cause of diseases classified elsewhere: Secondary | ICD-10-CM | POA: Insufficient documentation

## 2018-06-30 LAB — GROUP A STREP BY PCR: GROUP A STREP BY PCR: DETECTED — AB

## 2018-06-30 MED ORDER — AMOXICILLIN 875 MG PO TABS: 875.0000 mg | ORAL_TABLET | Freq: Two times a day (BID) | ORAL | 0 refills | Status: DC

## 2018-06-30 MED ORDER — IBUPROFEN 800 MG PO TABS: 800.0000 mg | ORAL_TABLET | Freq: Three times a day (TID) | ORAL | 0 refills | Status: DC | PRN
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
## 2018-06-30 NOTE — ED Triage Notes (Signed)
Pt presents with difficulty swallowing x 2 days; pt denies any URI symptoms, denies any know sick contact.  Pt denies any cough.

## 2018-06-30 NOTE — Discharge Instructions (Signed)
Return here as needed.  Follow-up with your primary doctor or an urgent care as needed.  Increase your fluid intake and rest as much as possible.  Make sure you finish all of the antibiotics.

## 2018-06-30 NOTE — ED Provider Notes (Signed)
Patient placed in Quick Look pathway, seen and evaluated   Chief Complaint: sore throat  HPI:   Sore throat x 3 days, neck pain  ROS: no fever, ear pain. (one)  Physical Exam:   Gen: No distress  Neuro: Awake and Alert  Skin: Warm    Focused Exam: throat: uvula midline, bilateral tonsillar enlargement with exudates. Neck: lymphadenopathy   Initiation of care has begun. The patient has been counseled on the process, plan, and necessity for staying for the completion/evaluation, and the remainder of the medical screening examination    Fayrene Helperran, Ceriah Kohler, Cordelia Poche-C 06/30/18 1556    Eber HongMiller, Brian, MD 07/03/18 [phone]

## 2019-05-10 ENCOUNTER — Other Ambulatory Visit: Payer: Self-pay

## 2019-05-10 ENCOUNTER — Emergency Department (HOSPITAL_COMMUNITY): Payer: Self-pay

## 2019-05-10 ENCOUNTER — Emergency Department (HOSPITAL_COMMUNITY)
Admission: EM | Admit: 2019-05-10 | Discharge: 2019-05-10 | Disposition: A | Discharge status: Home / Self Care | Payer: Self-pay | Attending: Emergency Medicine | Admitting: Emergency Medicine

## 2019-05-10 ENCOUNTER — Encounter (HOSPITAL_COMMUNITY): Payer: Self-pay

## 2019-05-10 DIAGNOSIS — Z79899 Other long term (current) drug therapy: Secondary | ICD-10-CM | POA: Insufficient documentation

## 2019-05-10 DIAGNOSIS — Y9389 Activity, other specified: Secondary | ICD-10-CM | POA: Insufficient documentation

## 2019-05-10 DIAGNOSIS — S62391A Other fracture of second metacarpal bone, left hand, initial encounter for closed fracture: Secondary | ICD-10-CM | POA: Insufficient documentation

## 2019-05-10 DIAGNOSIS — Y929 Unspecified place or not applicable: Secondary | ICD-10-CM | POA: Insufficient documentation

## 2019-05-10 DIAGNOSIS — Y999 Unspecified external cause status: Secondary | ICD-10-CM | POA: Insufficient documentation
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
## 2019-05-10 NOTE — Progress Notes (Signed)
Orthopedic Tech Progress Note Patient Details:  Shawn Herrera [birth year] [id number]  Ortho Devices Type of Ortho Device: Rad Gutter splint Ortho Device/Splint Interventions: Adjustment, Application, Ordered   Post Interventions Patient Tolerated: Well Instructions Provided: Care of device, Adjustment of device   Norva Karvonen T 05/10/2019, 6:32 PM
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
## 2019-05-10 NOTE — ED Notes (Signed)
Patient verbalizes understanding of discharge instructions . Opportunity for questions and answers were provided . Armband removed by staff ,Pt discharged from ED. W/C  offered at D/C  and Declined W/C at D/C and was escorted to lobby by RN.  
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
## 2019-05-20 ENCOUNTER — Other Ambulatory Visit: Payer: Self-pay

## 2019-05-20 ENCOUNTER — Encounter (HOSPITAL_COMMUNITY): Payer: Self-pay

## 2019-05-20 ENCOUNTER — Emergency Department (HOSPITAL_COMMUNITY)
Admission: EM | Admit: 2019-05-20 | Discharge: 2019-05-20 | Disposition: A | Discharge status: Home / Self Care | Payer: Self-pay | Attending: Emergency Medicine | Admitting: Emergency Medicine

## 2019-05-20 DIAGNOSIS — M25532 Pain in left wrist: Secondary | ICD-10-CM | POA: Insufficient documentation

## 2019-05-20 DIAGNOSIS — Z4789 Encounter for other orthopedic aftercare: Secondary | ICD-10-CM | POA: Insufficient documentation

## 2019-05-20 NOTE — ED Notes (Signed)
Ortho tech paged  
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
## 2019-05-20 NOTE — Discharge Instructions (Addendum)
1. Medications: alternate ibuprofen and tylenol for pain control, usual home medications 2. Treatment: rest, ice, elevate, keep cast clean and dry, drink plenty of fluids, gentle stretching 3. Follow Up: Please followup with orthopedics at your appointment on Monday morning; Please return to the ER for worsening symptoms or other concerns
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
## 2020-01-19 ENCOUNTER — Encounter (HOSPITAL_COMMUNITY): Payer: Self-pay

## 2020-01-19 ENCOUNTER — Other Ambulatory Visit: Payer: Self-pay

## 2020-01-19 ENCOUNTER — Emergency Department (HOSPITAL_COMMUNITY)
Admission: EM | Admit: 2020-01-19 | Discharge: 2020-01-19 | Disposition: A | Discharge status: Home / Self Care | Payer: Self-pay | Attending: Emergency Medicine | Admitting: Emergency Medicine

## 2020-01-19 DIAGNOSIS — K0381 Cracked tooth: Secondary | ICD-10-CM | POA: Insufficient documentation

## 2020-01-19 DIAGNOSIS — K0889 Other specified disorders of teeth and supporting structures: Secondary | ICD-10-CM

## 2020-01-19 DIAGNOSIS — Z79899 Other long term (current) drug therapy: Secondary | ICD-10-CM | POA: Insufficient documentation

## 2020-01-19 DIAGNOSIS — K029 Dental caries, unspecified: Secondary | ICD-10-CM | POA: Insufficient documentation

## 2020-01-19 MED ORDER — NAPROXEN 500 MG PO TABS: 500.0000 mg | ORAL_TABLET | Freq: Two times a day (BID) | ORAL | 0 refills | Status: DC

## 2020-01-19 MED ORDER — PENICILLIN V POTASSIUM 500 MG PO TABS: 500.0000 mg | ORAL_TABLET | Freq: Four times a day (QID) | ORAL | 0 refills | Status: AC

## 2020-01-19 NOTE — ED Notes (Signed)
Patient verbalizes understanding of discharge instructions. Opportunity for questioning and answers were provided. Armband removed by staff, pt discharged from ED.  

## 2020-01-19 NOTE — ED Triage Notes (Signed)
Pt reports left sided dental pain for the past three days, states he thinks a filling fell out. No swelling noted.
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
## 2020-03-16 ENCOUNTER — Emergency Department (HOSPITAL_COMMUNITY)
Admission: EM | Admit: 2020-03-16 | Discharge: 2020-03-16 | Disposition: A | Discharge status: Home / Self Care | Payer: Self-pay | Attending: Emergency Medicine | Admitting: Emergency Medicine

## 2020-03-16 ENCOUNTER — Other Ambulatory Visit: Payer: Self-pay

## 2020-03-16 DIAGNOSIS — Z79899 Other long term (current) drug therapy: Secondary | ICD-10-CM | POA: Insufficient documentation

## 2020-03-16 DIAGNOSIS — U071 COVID-19: Secondary | ICD-10-CM

## 2020-03-16 DIAGNOSIS — Z8616 Personal history of COVID-19: Secondary | ICD-10-CM | POA: Insufficient documentation

## 2020-03-16 LAB — POC SARS CORONAVIRUS 2 AG -  ED: SARS Coronavirus 2 Ag: POSITIVE — AB
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
## 2020-03-16 NOTE — ED Triage Notes (Signed)
Pt arrives pov with complaint of fevers intermittently for the last 2 days. Reports loss of smell and taste. States could not taste his toothpaste this morning.

## 2020-03-16 NOTE — Discharge Instructions (Addendum)
You have been diagnosed today with COVID-19 viral infection.  At this time there does not appear to be the presence of an emergent medical condition, however there is always the potential for conditions to change. Please read and follow the below instructions.  Please return to the Emergency Department immediately for any new or worsening symptoms. Please be sure to follow up with your Primary Care Provider within one week regarding your visit today; please call their office to schedule an appointment even if you are feeling better for a follow-up visit. Please drink plenty water and get plenty of rest.  You may use over-the-counter Tylenol and ibuprofen as directed on the packaging to help with your symptoms.  Please continue to quarantine until symptom-free x7 days.  If your symptoms started 2 days ago you must continue to quarantine until at least 10 days after your symptoms began.  You will be given a work note today.  You may return to your work on April 5 so long as your symptoms have resolved.  Get help right away if: You have trouble breathing. You have pain or pressure in your chest. You have confusion. You have bluish lips and fingernails. You have difficulty waking from sleep. You have any new/concerning or worsening of symptoms  Please read the additional information packets attached to your discharge summary.  Do not take your medicine if  develop an itchy rash, swelling in your mouth or lips, or difficulty breathing; call 911 and seek immediate emergency medical attention if this occurs.  Note: Portions of this text may have been transcribed using voice recognition software. Every effort was made to ensure accuracy; however, inadvertent computerized transcription errors may still be present.

## 2020-03-16 NOTE — ED Notes (Signed)
Pt ambulated around the room just fine O2 saturation stayed at 99-100% the entire time.

## 2020-05-12 ENCOUNTER — Other Ambulatory Visit: Payer: Self-pay

## 2020-05-12 ENCOUNTER — Encounter (HOSPITAL_COMMUNITY): Payer: Self-pay

## 2020-05-12 ENCOUNTER — Ambulatory Visit (HOSPITAL_COMMUNITY)
Admission: EM | Admit: 2020-05-12 | Discharge: 2020-05-12 | Disposition: A | Discharge status: Home / Self Care | Payer: Self-pay | Attending: Emergency Medicine | Admitting: Emergency Medicine

## 2020-05-12 DIAGNOSIS — Z113 Encounter for screening for infections with a predominantly sexual mode of transmission: Secondary | ICD-10-CM | POA: Insufficient documentation

## 2020-05-12 DIAGNOSIS — Z202 Contact with and (suspected) exposure to infections with a predominantly sexual mode of transmission: Secondary | ICD-10-CM | POA: Insufficient documentation

## 2020-05-12 LAB — HIV ANTIBODY (ROUTINE TESTING W REFLEX): HIV Screen 4th Generation wRfx: NONREACTIVE

## 2020-05-12 MED ORDER — AZITHROMYCIN 250 MG PO TABS
ORAL_TABLET | ORAL | Status: AC
  Filled 2020-05-12: qty 4

## 2020-05-12 MED ORDER — AZITHROMYCIN 250 MG PO TABS
1000.0000 mg | ORAL_TABLET | Freq: Once | ORAL | Status: AC
  Administered 2020-05-12: 1000 mg via ORAL
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
## 2020-05-12 NOTE — Discharge Instructions (Signed)
Azithromycin 1 g p.o. was given in office for chlamydia Take medications as prescribed and to completion If tests results are positive, please abstain from sexual activity until you and your partner(s) have been treated Follow up with PCP or [facility] if symptoms persists Return here or go to ER if you have any new or worsening symptoms fever, chills, nausea, vomiting, abdominal or pelvic pain, painful intercourse, vaginal discharge, vaginal bleeding, persistent symptoms despite treatment, etc..Marland Kitchen

## 2020-05-12 NOTE — ED Triage Notes (Signed)
Patient reports yellow penile discharge noticed two days ago. Reports he recently had unprotected sex. Requesting STD screening

## 2020-05-13 LAB — RPR: RPR Ser Ql: NONREACTIVE

## 2020-05-15 ENCOUNTER — Telehealth (HOSPITAL_COMMUNITY): Payer: Self-pay

## 2020-05-15 LAB — CYTOLOGY, (ORAL, ANAL, URETHRAL) ANCILLARY ONLY
Chlamydia: NEGATIVE
Comment: NEGATIVE
Comment: NEGATIVE
Comment: NORMAL
Neisseria Gonorrhea: POSITIVE — AB
Trichomonas: NEGATIVE

## 2020-05-15 NOTE — Telephone Encounter (Signed)
Attempted to reach patient. No answer at this time. Voicemail left.   Test for gonorrhea was positive. Pt needs to return to UC for tx with IM Rocephin.  Please refrain from sexual intercourse for 7 days after treatment to give the medicine time to work. Sexual partners need to be notified and tested/treated. Condoms may reduce risk of reinfection. Recheck or followup with PCP for further evaluation if symptoms are not improving.

## 2020-05-19 ENCOUNTER — Other Ambulatory Visit: Payer: Self-pay

## 2020-05-19 ENCOUNTER — Encounter (HOSPITAL_COMMUNITY): Payer: Self-pay

## 2020-05-19 ENCOUNTER — Ambulatory Visit (HOSPITAL_COMMUNITY)
Admission: EM | Admit: 2020-05-19 | Discharge: 2020-05-19 | Disposition: A | Discharge status: Home / Self Care | Payer: Self-pay | Attending: Emergency Medicine | Admitting: Emergency Medicine

## 2020-05-19 DIAGNOSIS — A549 Gonococcal infection, unspecified: Secondary | ICD-10-CM

## 2020-05-19 MED ORDER — LIDOCAINE HCL (PF) 1 % IJ SOLN
INTRAMUSCULAR | Status: AC
  Filled 2020-05-19: qty 2

## 2020-05-19 MED ORDER — CEFTRIAXONE SODIUM 500 MG IJ SOLR
500.0000 mg | Freq: Once | INTRAMUSCULAR | Status: AC
  Administered 2020-05-19: 500 mg via INTRAMUSCULAR

## 2020-05-19 MED ORDER — CEFTRIAXONE SODIUM 500 MG IJ SOLR
INTRAMUSCULAR | Status: AC
  Filled 2020-05-19: qty 500

## 2020-05-19 NOTE — ED Triage Notes (Signed)
Pt presents to UC for Gonorrhea treatment.

## 2020-05-19 NOTE — ED Notes (Signed)
Bed: UCTR Expected date:  Expected time:  Means of arrival:  Comments: 

## 2020-06-20 ENCOUNTER — Ambulatory Visit (HOSPITAL_COMMUNITY)
Admission: EM | Admit: 2020-06-20 | Discharge: 2020-06-20 | Disposition: A | Discharge status: Home / Self Care | Payer: Self-pay | Attending: Family Medicine | Admitting: Family Medicine

## 2020-06-20 ENCOUNTER — Other Ambulatory Visit: Payer: Self-pay

## 2020-06-20 ENCOUNTER — Encounter (HOSPITAL_COMMUNITY): Payer: Self-pay | Admitting: Emergency Medicine

## 2020-06-20 DIAGNOSIS — J029 Acute pharyngitis, unspecified: Secondary | ICD-10-CM | POA: Insufficient documentation

## 2020-06-20 DIAGNOSIS — Z20822 Contact with and (suspected) exposure to covid-19: Secondary | ICD-10-CM | POA: Insufficient documentation

## 2020-06-20 LAB — SARS CORONAVIRUS 2 (TAT 6-24 HRS): SARS Coronavirus 2: NEGATIVE

## 2020-06-20 LAB — POCT RAPID STREP A: Streptococcus, Group A Screen (Direct): NEGATIVE

## 2020-06-20 MED ORDER — IBUPROFEN 800 MG PO TABS
800.0000 mg | ORAL_TABLET | Freq: Three times a day (TID) | ORAL | 0 refills | Status: DC
Start: 2020-06-20 — End: 2024-09-14

## 2020-06-20 MED ORDER — ACETAMINOPHEN 325 MG PO TABS
ORAL_TABLET | ORAL | Status: AC
  Filled 2020-06-20: qty 2

## 2020-06-20 MED ORDER — AMOXICILLIN 875 MG PO TABS
875.0000 mg | ORAL_TABLET | Freq: Two times a day (BID) | ORAL | 0 refills | Status: AC
Start: 2020-06-20 — End: 2020-06-30

## 2020-06-20 MED ORDER — ACETAMINOPHEN 325 MG PO TABS
650.0000 mg | ORAL_TABLET | Freq: Once | ORAL | Status: AC
  Administered 2020-06-20: 650 mg via ORAL

## 2020-06-20 NOTE — ED Triage Notes (Signed)
Patient has a sore throat, difficulty swallowing, and has been sweating.  The first day did run a fever, none since the first day.  Patient has had symptoms for 3 days

## 2020-06-20 NOTE — Discharge Instructions (Signed)
You have been tested for COVID-19 today. °If your test returns positive, you will receive a phone call from [place] regarding your results. °Negative test results are not called. °Both positive and negative results area always visible on MyChart. °If you do not have a MyChart account, sign up instructions are provided in your discharge papers. °Please do not hesitate to contact us should you have questions or concerns. ° °
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
## 2020-06-23 LAB — CULTURE, GROUP A STREP (THRC)

## 2021-04-23 ENCOUNTER — Ambulatory Visit (HOSPITAL_COMMUNITY)
Admission: EM | Admit: 2021-04-23 | Discharge: 2021-04-23 | Disposition: A | Discharge status: Home / Self Care | Payer: Self-pay | Attending: Emergency Medicine | Admitting: Emergency Medicine

## 2021-04-23 ENCOUNTER — Encounter (HOSPITAL_COMMUNITY): Payer: Self-pay

## 2021-04-23 ENCOUNTER — Other Ambulatory Visit: Payer: Self-pay

## 2021-04-23 DIAGNOSIS — Z202 Contact with and (suspected) exposure to infections with a predominantly sexual mode of transmission: Secondary | ICD-10-CM | POA: Insufficient documentation

## 2021-04-23 LAB — HIV ANTIBODY (ROUTINE TESTING W REFLEX): HIV Screen 4th Generation wRfx: NONREACTIVE

## 2021-04-23 MED ORDER — CEFTRIAXONE SODIUM 500 MG IJ SOLR
INTRAMUSCULAR | Status: AC
  Filled 2021-04-23: qty 500

## 2021-04-23 MED ORDER — AZITHROMYCIN 250 MG PO TABS
1000.0000 mg | ORAL_TABLET | Freq: Once | ORAL | Status: AC
  Administered 2021-04-23: 1000 mg via ORAL

## 2021-04-23 MED ORDER — CEFTRIAXONE SODIUM 500 MG IJ SOLR
500.0000 mg | Freq: Once | INTRAMUSCULAR | Status: AC
  Administered 2021-04-23: 500 mg via INTRAMUSCULAR

## 2021-04-23 MED ORDER — AZITHROMYCIN 250 MG PO TABS
ORAL_TABLET | ORAL | Status: AC
  Filled 2021-04-23: qty 4

## 2021-04-23 MED ORDER — LIDOCAINE HCL (PF) 1 % IJ SOLN
INTRAMUSCULAR | Status: AC
  Filled 2021-04-23: qty 2

## 2021-04-23 NOTE — ED Triage Notes (Signed)
Pt requesting std testing. Reports sexual partner is having symptoms but has not tested positive for anything yet. Pt denies any symptoms.

## 2021-04-23 NOTE — Discharge Instructions (Signed)
Medication given today to cover for gonorrhea and chlamydia, at your request. Labs sent out today and we will call you with results, and they will be available on MyChart. No intercourse until results return, and if positive then not until at least 7 days after medication given.
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
## 2021-04-24 LAB — CYTOLOGY, (ORAL, ANAL, URETHRAL) ANCILLARY ONLY
Chlamydia: NEGATIVE
Comment: NEGATIVE
Comment: NEGATIVE
Comment: NORMAL
Neisseria Gonorrhea: NEGATIVE
Trichomonas: NEGATIVE

## 2021-04-24 LAB — HEPATITIS PANEL, ACUTE
HCV Ab: NONREACTIVE
Hep A IgM: NONREACTIVE
Hep B C IgM: NONREACTIVE
Hepatitis B Surface Ag: NONREACTIVE

## 2021-04-24 LAB — RPR: RPR Ser Ql: NONREACTIVE

## 2023-08-22 ENCOUNTER — Other Ambulatory Visit: Payer: Self-pay

## 2023-08-22 ENCOUNTER — Emergency Department (HOSPITAL_COMMUNITY): Payer: Self-pay

## 2023-08-22 ENCOUNTER — Emergency Department (HOSPITAL_COMMUNITY)
Admission: EM | Admit: 2023-08-22 | Discharge: 2023-08-23 | Disposition: A | Discharge status: Home / Self Care | Payer: Self-pay | Attending: Emergency Medicine | Admitting: Emergency Medicine

## 2023-08-22 ENCOUNTER — Encounter (HOSPITAL_COMMUNITY): Payer: Self-pay | Admitting: *Deleted

## 2023-08-22 DIAGNOSIS — K529 Noninfective gastroenteritis and colitis, unspecified: Secondary | ICD-10-CM | POA: Insufficient documentation

## 2023-08-22 LAB — COMPREHENSIVE METABOLIC PANEL
ALT: 18 U/L (ref 0–44)
AST: 19 U/L (ref 15–41)
Albumin: 3.9 g/dL (ref 3.5–5.0)
Alkaline Phosphatase: 55 U/L (ref 38–126)
Anion gap: 10 (ref 5–15)
BUN: 17 mg/dL (ref 6–20)
CO2: 25 mmol/L (ref 22–32)
Calcium: 8.9 mg/dL (ref 8.9–10.3)
Chloride: 101 mmol/L (ref 98–111)
Creatinine, Ser: 1.45 mg/dL — ABNORMAL HIGH (ref 0.61–1.24)
GFR, Estimated: >60 mL/min (ref >60)
Glucose, Bld: 102 mg/dL — ABNORMAL HIGH (ref 70–99)
Potassium: 3.6 mmol/L (ref 3.5–5.1)
Sodium: 136 mmol/L (ref 135–145)
Total Bilirubin: 0.4 mg/dL (ref 0.3–1.2)
Total Protein: 7.1 g/dL (ref 6.5–8.1)

## 2023-08-22 LAB — URINALYSIS, ROUTINE W REFLEX MICROSCOPIC
Bilirubin Urine: NEGATIVE
Glucose, UA: NEGATIVE mg/dL
Hgb urine dipstick: NEGATIVE
Ketones, ur: NEGATIVE mg/dL
Leukocytes,Ua: NEGATIVE
Nitrite: NEGATIVE
Protein, ur: NEGATIVE mg/dL
Specific Gravity, Urine: 1.023 (ref 1.005–1.030)
pH: 5 (ref 5.0–8.0)

## 2023-08-22 LAB — CBC
HCT: 41.3 % (ref 39.0–52.0)
Hemoglobin: 13 g/dL (ref 13.0–17.0)
MCH: 25.5 pg — ABNORMAL LOW (ref 26.0–34.0)
MCHC: 31.5 g/dL (ref 30.0–36.0)
MCV: 81.1 fL (ref 80.0–100.0)
Platelets: 185 10*3/uL (ref 150–400)
RBC: 5.09 MIL/uL (ref 4.22–5.81)
RDW: 12.3 % (ref 11.5–15.5)
WBC: 3.9 10*3/uL — ABNORMAL LOW (ref 4.0–10.5)
nRBC: 0 % (ref 0.0–0.2)

## 2023-08-22 LAB — LIPASE, BLOOD: Lipase: 35 U/L (ref 11–51)

## 2023-08-22 NOTE — ED Provider Notes (Signed)
EMERGENCY DEPARTMENT AT [facility] Provider Note   CSN: [id number] Arrival date & time: 08/22/23  2111     History {Add pertinent medical, surgical, social history, OB history to HPI:1} Chief Complaint  Patient presents with   Abdominal Pain    ANTONY PYATT is a 37 y.o. male.  The history is provided by the patient and medical records.  Abdominal Pain  37 y.o. M with no significant past medical history presenting to the ED with lower abdominal pain.  Reports 2 days ago he started having diarrhea, now was not able to have a bowel movement at all.  States he feels the urge to go but unable to pass any stool.  He did eat some watermelon that seemed old so that may have caused his symptoms, but also has had a few sick contacts of possible GI bug as well.  He has not had any fever.  No nausea or vomiting.  He is able to eat and drink some but "feels full".  No prior abdominal surgeries.  He has no prior GI history.  Home Medications Prior to Admission medications   Medication Sig Start Date End Date Taking? Authorizing Provider  acetaminophen (TYLENOL) 325 MG tablet Take 650 mg by mouth every 6 (six) hours as needed.    [provider]  ibuprofen (ADVIL) 800 MG tablet Take 1 tablet (800 mg total) by mouth 3 (three) times daily with meals. 06/20/20   Mardella Layman, MD  promethazine (PHENERGAN) 25 MG tablet Take 1 tablet (25 mg total) by mouth every 6 (six) hours as needed for nausea or vomiting. 02/03/18 06/20/20  Antony Madura, PA-C  ranitidine (ZANTAC) 150 MG tablet Take 1 tablet (150 mg total) by mouth 2 (two) times daily. 02/11/17 06/20/20  Janne Napoleon, NP      Allergies    Patient has no known allergies.    Review of Systems   Review of Systems  Gastrointestinal:  Positive for abdominal pain.  All other systems reviewed and are negative.   Physical Exam Updated Vital Signs BP (!) 149/107 (BP Location: Right Arm)   Pulse 73   Temp 98.3 F (36.8  C) (Oral)   Resp 18   Ht 6\' 1"  (1.854 m)   Wt 81.6 kg   SpO2 100%   BMI 23.73 kg/m   Physical Exam Vitals and nursing note reviewed.  Constitutional:      Appearance: He is well-developed.  HENT:     Head: Normocephalic and atraumatic.  Eyes:     Conjunctiva/sclera: Conjunctivae normal.     Pupils: Pupils are equal, round, and reactive to light.  Cardiovascular:     Rate and Rhythm: Normal rate and regular rhythm.     Heart sounds: Normal heart sounds.  Pulmonary:     Effort: Pulmonary effort is normal.     Breath sounds: Normal breath sounds.  Abdominal:     General: Bowel sounds are normal.     Palpations: Abdomen is soft.     Tenderness: There is abdominal tenderness in the right lower quadrant, periumbilical area and suprapubic area.  Musculoskeletal:        General: Normal range of motion.     Cervical back: Normal range of motion.  Skin:    General: Skin is warm and dry.  Neurological:     Mental Status: He is alert and oriented to person, place, and time.     ED Results / Procedures / Treatments  Labs (all labs ordered are listed, but only abnormal results are displayed) Labs Reviewed  COMPREHENSIVE METABOLIC PANEL - Abnormal; Notable for the following components:      Result Value   Glucose, Bld 102 (*)    Creatinine, Ser 1.45 (*)    All other components within normal limits  CBC - Abnormal; Notable for the following components:   WBC 3.9 (*)    MCH 25.5 (*)    All other components within normal limits  LIPASE, BLOOD  URINALYSIS, ROUTINE W REFLEX MICROSCOPIC    EKG None  Radiology No results found.  Procedures Procedures  {Document cardiac monitor, telemetry assessment procedure when appropriate:1}  Medications Ordered in ED Medications - No data to display  ED Course/ Medical Decision Making/ A&P   {   Click here for ABCD2, HEART and other calculatorsREFRESH Note before signing :1}                              Medical Decision  Making Amount and/or Complexity of Data Reviewed Radiology: ordered.   ***  {Document critical care time when appropriate:1} {Document review of labs and clinical decision tools ie heart score, Chads2Vasc2 etc:1}  {Document your independent review of radiology images, and any outside records:1} {Document your discussion with family members, caretakers, and with consultants:1} {Document social determinants of health affecting pt's care:1} {Document your decision making why or why not admission, treatments were needed:1} Final Clinical Impression(s) / ED Diagnoses Final diagnoses:  None    Rx / DC Orders ED Discharge Orders     None

## 2023-08-22 NOTE — ED Triage Notes (Signed)
The pt is c/o abd pain and diarrhea since last but for the past few days he has had no bms

## 2023-08-23 ENCOUNTER — Emergency Department (HOSPITAL_COMMUNITY): Payer: Self-pay

## 2023-08-23 MED ORDER — IOHEXOL 350 MG/ML SOLN
75.0000 mL | Freq: Once | INTRAVENOUS | Status: AC | PRN
  Administered 2023-08-23: 75 mL via INTRAVENOUS

## 2023-08-23 MED ORDER — AMOXICILLIN-POT CLAVULANATE 875-125 MG PO TABS
1.0000 | ORAL_TABLET | Freq: Once | ORAL | Status: AC
  Administered 2023-08-23: 1 via ORAL
  Filled 2023-08-23: qty 1

## 2023-08-23 MED ORDER — ONDANSETRON 4 MG PO TBDP: 4.0000 mg | ORAL_TABLET | Freq: Three times a day (TID) | ORAL | 0 refills | Status: DC | PRN

## 2023-08-23 MED ORDER — AMOXICILLIN-POT CLAVULANATE 875-125 MG PO TABS: 1.0000 | ORAL_TABLET | Freq: Two times a day (BID) | ORAL | 0 refills | Status: DC

## 2023-08-23 NOTE — Discharge Instructions (Addendum)
Take the prescribed medication as directed.  Bland diet for now, advance as tolerated. Follow-up with your primary care doctor. Return to the ED for new or worsening symptoms.

## 2023-12-28 ENCOUNTER — Emergency Department (HOSPITAL_COMMUNITY)
Admission: EM | Admit: 2023-12-28 | Discharge: 2023-12-28 | Disposition: A | Discharge status: Home / Self Care | Payer: Self-pay | Attending: Emergency Medicine | Admitting: Emergency Medicine

## 2023-12-28 DIAGNOSIS — K029 Dental caries, unspecified: Secondary | ICD-10-CM

## 2023-12-28 DIAGNOSIS — K0889 Other specified disorders of teeth and supporting structures: Secondary | ICD-10-CM | POA: Insufficient documentation

## 2023-12-28 MED ORDER — LIDOCAINE VISCOUS HCL 2 % MT SOLN: 15.0000 mL | Freq: Once | OROMUCOSAL | Status: DC

## 2023-12-28 MED ORDER — AMOXICILLIN-POT CLAVULANATE 875-125 MG PO TABS: 1.0000 | ORAL_TABLET | Freq: Two times a day (BID) | ORAL | 0 refills | Status: AC

## 2023-12-28 MED ORDER — BENZOCAINE 20 % MT AERO
INHALATION_SPRAY | Freq: Once | OROMUCOSAL | Status: DC
  Filled 2023-12-28: qty 57

## 2023-12-28 NOTE — ED Triage Notes (Signed)
 Pt c/o dental pain near his molars/wisdom teeth for over one month.

## 2023-12-28 NOTE — ED Provider Notes (Signed)
 [place] [facility] AT [facility] Provider Note   CSN: [id number] Arrival date & time: 12/28/23  1238     History  Chief Complaint  Patient presents with   Dental Pain    Shawn Herrera is a 38 y.o. male.  37yoM here today for dental pain. Symptoms have been ongoing for a few months. He saw a dentist 3 months ago but didn't have insurance. No fever.   Dental Pain      Home Medications Prior to Admission medications   Medication Sig Start Date End Date Taking? Authorizing Provider  amoxicillin -clavulanate (AUGMENTIN ) 875-125 MG tablet Take 1 tablet by mouth every 12 (twelve) hours for 10 days. 12/28/23 01/07/24 Yes Mannie Pac T, DO  acetaminophen  (TYLENOL ) 325 MG tablet Take 650 mg by mouth every 6 (six) hours as needed.    [provider]  ibuprofen  (ADVIL ) 800 MG tablet Take 1 tablet (800 mg total) by mouth 3 (three) times daily with meals. 06/20/20   Rolinda Rogue, MD  ondansetron  (ZOFRAN -ODT) 4 MG disintegrating tablet Take 1 tablet (4 mg total) by mouth every 8 (eight) hours as needed for nausea. 08/23/23   Jarold Olam HERO, PA-C  promethazine  (PHENERGAN ) 25 MG tablet Take 1 tablet (25 mg total) by mouth every 6 (six) hours as needed for nausea or vomiting. 02/03/18 06/20/20  Keith Sor, PA-C  ranitidine  (ZANTAC ) 150 MG tablet Take 1 tablet (150 mg total) by mouth 2 (two) times daily. 02/11/17 06/20/20  Jamelle Lorrayne HERO, NP      Allergies    Patient has no known allergies.    Review of Systems   Review of Systems  Physical Exam Updated Vital Signs BP 132/72 (BP Location: Right Arm)   Pulse 83   Temp 98.9 F (37.2 C)   Resp 16   SpO2 100%  Physical Exam Vitals and nursing note reviewed.  HENT:     Head: Normocephalic and atraumatic.     Mouth/Throat:     Comments: Globally poor dentition. No swelling of the face, floor of mouth.    ED Results / Procedures / Treatments   Labs (all labs ordered are listed, but only abnormal  results are displayed) Labs Reviewed - No data to display  EKG None  Radiology No results found.  Procedures Procedures    Medications Ordered in ED Medications  lidocaine  (XYLOCAINE ) 2 % viscous mouth solution 15 mL (has no administration in time range)  Benzocaine  (HURRCAINE) 20 % mouth spray (has no administration in time range)    ED Course/ Medical Decision Making/ A&P                                 Medical Decision Making 37yoM here today for dental pain. Ddx includes dental caries, dental abscess, less likely deep space infection, less likely floor of mouth infection.  Plan- provided some soaked cotton balls with lidocaine  and benzocaine  to the patient. Augmentin  sent to pharmacy. Follow up with dentist. Return precautions explained.  Risk Prescription drug management.          Final Clinical Impression(s) / ED Diagnoses Final diagnoses:  None    Rx / DC Orders ED Discharge Orders          Ordered    amoxicillin -clavulanate (AUGMENTIN ) 875-125 MG tablet  Every 12 hours        12/28/23 1321  Mannie Pac T, DO 12/28/23 1321

## 2024-09-09 ENCOUNTER — Other Ambulatory Visit: Payer: Self-pay

## 2024-09-09 ENCOUNTER — Ambulatory Visit (HOSPITAL_COMMUNITY)
Admission: EM | Admit: 2024-09-09 | Discharge: 2024-09-09 | Disposition: A | Discharge status: Home / Self Care | Attending: Internal Medicine | Admitting: Internal Medicine

## 2024-09-09 ENCOUNTER — Encounter (HOSPITAL_COMMUNITY): Payer: Self-pay | Admitting: *Deleted

## 2024-09-09 DIAGNOSIS — Z113 Encounter for screening for infections with a predominantly sexual mode of transmission: Secondary | ICD-10-CM | POA: Diagnosis present

## 2024-09-09 NOTE — ED Triage Notes (Signed)
 PT denies any STD sx's . Pt just wants testing.

## 2024-09-09 NOTE — Discharge Instructions (Signed)
 Screening swab done today and results will be available in 24-48 hours. We will contact you if we need to arrange additional treatment based on your testing. Negative results will be on your MyChart account.
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
## 2024-09-10 LAB — CYTOLOGY, (ORAL, ANAL, URETHRAL) ANCILLARY ONLY
Chlamydia: NEGATIVE
Comment: NEGATIVE
Comment: NEGATIVE
Comment: NORMAL
Neisseria Gonorrhea: NEGATIVE
Trichomonas: NEGATIVE

## 2024-09-14 ENCOUNTER — Encounter (HOSPITAL_COMMUNITY): Payer: Self-pay | Admitting: *Deleted

## 2024-09-14 ENCOUNTER — Other Ambulatory Visit: Payer: Self-pay

## 2024-09-14 ENCOUNTER — Ambulatory Visit (HOSPITAL_COMMUNITY)
Admission: EM | Admit: 2024-09-14 | Discharge: 2024-09-14 | Disposition: A | Discharge status: Home / Self Care | Attending: Student | Admitting: Student

## 2024-09-14 DIAGNOSIS — J029 Acute pharyngitis, unspecified: Secondary | ICD-10-CM | POA: Diagnosis present

## 2024-09-14 LAB — POCT RAPID STREP A (OFFICE): Rapid Strep A Screen: NEGATIVE

## 2024-09-14 MED ORDER — LIDOCAINE VISCOUS HCL 2 % MT SOLN: 15.0000 mL | OROMUCOSAL | 0 refills | Status: DC | PRN

## 2024-09-14 NOTE — Discharge Instructions (Signed)
-   Your strep test was negative.  We will send a culture to the lab, and grow the bacteria to make sure that you do not have strep.  This may take 2 to 3 days. -Use the lidocaine  mouthwash to control your pain.  You can swish and spit the mouthwash. -Take tylenol  (up to 3000mg /day) and ibuprofen  (up to 3200mg /day) for pain. Take ibuprofen  with food.
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
## 2024-09-14 NOTE — ED Triage Notes (Signed)
 PT reports he has a sore throat for 3 day s. Pt wants testing for strep.

## 2024-09-17 ENCOUNTER — Ambulatory Visit (HOSPITAL_COMMUNITY): Payer: Self-pay

## 2024-09-17 LAB — CULTURE, GROUP A STREP (THRC)

## 2025-01-01 ENCOUNTER — Ambulatory Visit (HOSPITAL_COMMUNITY)
Admission: EM | Admit: 2025-01-01 | Discharge: 2025-01-01 | Disposition: A | Discharge status: Home / Self Care | Attending: Family Medicine | Admitting: Family Medicine

## 2025-01-01 ENCOUNTER — Encounter (HOSPITAL_COMMUNITY): Payer: Self-pay

## 2025-01-01 DIAGNOSIS — M79671 Pain in right foot: Secondary | ICD-10-CM

## 2025-01-01 DIAGNOSIS — S91331A Puncture wound without foreign body, right foot, initial encounter: Secondary | ICD-10-CM | POA: Diagnosis not present

## 2025-01-01 DIAGNOSIS — Z23 Encounter for immunization: Secondary | ICD-10-CM

## 2025-01-01 MED ORDER — TETANUS-DIPHTH-ACELL PERTUSSIS 5-2-15.5 LF-MCG/0.5 IM SUSP
INTRAMUSCULAR | Status: AC
  Filled 2025-01-01: qty 0.5

## 2025-01-01 MED ORDER — AMOXICILLIN-POT CLAVULANATE 875-125 MG PO TABS: 1.0000 | ORAL_TABLET | Freq: Two times a day (BID) | ORAL | 0 refills | Status: AC

## 2025-01-01 MED ORDER — TETANUS-DIPHTH-ACELL PERTUSSIS 5-2-15.5 LF-MCG/0.5 IM SUSP
0.5000 mL | Freq: Once | INTRAMUSCULAR | Status: AC
  Administered 2025-01-01: 0.5 mL via INTRAMUSCULAR
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
## 2025-01-01 NOTE — ED Triage Notes (Addendum)
 Patient stepped on a nail with his right foot yesterday. Area is red.   Patient states he has not had a Tetanus since Elementary school.   Patient has not had any medications for pain.

## 2025-01-01 NOTE — Discharge Instructions (Signed)
 You have been given a Tdap vaccination to boost your tetanus immunity  Take amoxicillin -clavulanate 875 mg--1 tab twice daily with food for 5 days  You can go to the website [facility] [email or web address] to schedule yourself a new patient appointment with primary care

## 2025-01-06 ENCOUNTER — Other Ambulatory Visit: Payer: Self-pay

## 2025-01-06 ENCOUNTER — Emergency Department (HOSPITAL_COMMUNITY)

## 2025-01-06 ENCOUNTER — Emergency Department (HOSPITAL_COMMUNITY)
Admission: EM | Admit: 2025-01-06 | Discharge: 2025-01-06 | Disposition: A | Discharge status: Home / Self Care | Attending: Emergency Medicine | Admitting: Emergency Medicine

## 2025-01-06 ENCOUNTER — Encounter (HOSPITAL_COMMUNITY): Payer: Self-pay | Admitting: *Deleted

## 2025-01-06 DIAGNOSIS — R1012 Left upper quadrant pain: Secondary | ICD-10-CM | POA: Diagnosis present

## 2025-01-06 DIAGNOSIS — R1110 Vomiting, unspecified: Secondary | ICD-10-CM | POA: Diagnosis not present

## 2025-01-06 DIAGNOSIS — K5900 Constipation, unspecified: Secondary | ICD-10-CM | POA: Insufficient documentation

## 2025-01-06 DIAGNOSIS — R7989 Other specified abnormal findings of blood chemistry: Secondary | ICD-10-CM | POA: Diagnosis not present

## 2025-01-06 DIAGNOSIS — R7309 Other abnormal glucose: Secondary | ICD-10-CM | POA: Insufficient documentation

## 2025-01-06 DIAGNOSIS — R739 Hyperglycemia, unspecified: Secondary | ICD-10-CM

## 2025-01-06 LAB — COMPREHENSIVE METABOLIC PANEL WITH GFR
ALT: 12 U/L (ref 0–44)
AST: 24 U/L (ref 15–41)
Albumin: 4.4 g/dL (ref 3.5–5.0)
Alkaline Phosphatase: 63 U/L (ref 38–126)
Anion gap: 9 (ref 5–15)
BUN: 12 mg/dL (ref 6–20)
CO2: 28 mmol/L (ref 22–32)
Calcium: 9.5 mg/dL (ref 8.9–10.3)
Chloride: 100 mmol/L (ref 98–111)
Creatinine, Ser: 1.27 mg/dL — ABNORMAL HIGH (ref 0.61–1.24)
GFR, Estimated: >60 mL/min
Glucose, Bld: 115 mg/dL — ABNORMAL HIGH (ref 70–99)
Potassium: 4.7 mmol/L (ref 3.5–5.1)
Sodium: 138 mmol/L (ref 135–145)
Total Bilirubin: 0.6 mg/dL (ref 0.0–1.2)
Total Protein: 7.5 g/dL (ref 6.5–8.1)

## 2025-01-06 LAB — LIPASE, BLOOD: Lipase: 42 U/L (ref 11–51)

## 2025-01-06 LAB — URINALYSIS, ROUTINE W REFLEX MICROSCOPIC
Bilirubin Urine: NEGATIVE
Glucose, UA: NEGATIVE mg/dL
Hgb urine dipstick: NEGATIVE
Ketones, ur: NEGATIVE mg/dL
Leukocytes,Ua: NEGATIVE
Nitrite: NEGATIVE
Protein, ur: NEGATIVE mg/dL
Specific Gravity, Urine: 1.017 (ref 1.005–1.030)
pH: 8 (ref 5.0–8.0)

## 2025-01-06 LAB — CBC
HCT: 44.1 % (ref 39.0–52.0)
Hemoglobin: 14.2 g/dL (ref 13.0–17.0)
MCH: 26.4 pg (ref 26.0–34.0)
MCHC: 32.2 g/dL (ref 30.0–36.0)
MCV: 82.1 fL (ref 80.0–100.0)
Platelets: 167 K/uL (ref 150–400)
RBC: 5.37 MIL/uL (ref 4.22–5.81)
RDW: 12.2 % (ref 11.5–15.5)
WBC: 10.1 K/uL (ref 4.0–10.5)
nRBC: 0 % (ref 0.0–0.2)

## 2025-01-06 MED ORDER — IOHEXOL 350 MG/ML SOLN
75.0000 mL | Freq: Once | INTRAVENOUS | Status: AC | PRN
  Administered 2025-01-06: 75 mL via INTRAVENOUS

## 2025-01-06 MED ORDER — BISACODYL 5 MG PO TBEC
5.0000 mg | DELAYED_RELEASE_TABLET | Freq: Once | ORAL | Status: AC
  Administered 2025-01-06: 5 mg via ORAL
  Filled 2025-01-06: qty 1

## 2025-01-06 NOTE — ED Provider Notes (Signed)
 " [facility] Provider Note   CSN: [id number] Arrival date & time: 01/06/25  9780     Patient presents with: Abdominal Pain   Shawn Herrera is a 39 y.o. male.   The history is provided by the patient.  Abdominal Pain  He has no significant past history and comes in complaining of left upper quadrant pain for the last 2 days with some radiation to the periumbilical area.  He did have 1 episode of vomiting.  He states he has been constipated but symptoms are not affected by passing flatus or by eating.  He denies fever or chills.    Prior to Admission medications  Medication Sig Start Date End Date Taking? Authorizing Provider  acetaminophen  (TYLENOL ) 325 MG tablet Take 650 mg by mouth every 6 (six) hours as needed.    [provider]  amoxicillin -clavulanate (AUGMENTIN ) 875-125 MG tablet Take 1 tablet by mouth 2 (two) times daily for 5 days. 01/01/25 01/06/25  Vonna Sharlet POUR, MD  promethazine  (PHENERGAN ) 25 MG tablet Take 1 tablet (25 mg total) by mouth every 6 (six) hours as needed for nausea or vomiting. 02/03/18 06/20/20  Keith Sor, PA-C  ranitidine  (ZANTAC ) 150 MG tablet Take 1 tablet (150 mg total) by mouth 2 (two) times daily. 02/11/17 06/20/20  Jamelle Lorrayne HERO, NP    Allergies: Patient has no known allergies.    Review of Systems  Gastrointestinal:  Positive for abdominal pain.  All other systems reviewed and are negative.   Updated Vital Signs BP 136/75 (BP Location: Right Arm)   Pulse 82   Temp 98.3 F (36.8 C)   Resp 18   Ht 6' 1 (1.854 m)   Wt 81.6 kg   SpO2 99%   BMI 23.73 kg/m   Physical Exam Vitals and nursing note reviewed.   39 year old male, resting comfortably and in no acute distress. Vital signs are normal. Oxygen saturation is 99%, which is normal. Head is normocephalic and atraumatic. PERRLA, EOMI.  Lungs are clear without rales, wheezes, or rhonchi. Heart has regular rate and rhythm  without murmur. Abdomen is soft, flat, with mild left upper quadrant tenderness. There is a small umbilical hernia which is nontender and easily reducible. Skin is warm and dry without rash. Neurologic: Awake and alert, moves all extremities equally.  (all labs ordered are listed, but only abnormal results are displayed) Labs Reviewed  COMPREHENSIVE METABOLIC PANEL WITH GFR - Abnormal; Notable for the following components:      Result Value   Glucose, Bld 115 (*)    Creatinine, Ser 1.27 (*)    All other components within normal limits  URINALYSIS, ROUTINE W REFLEX MICROSCOPIC - Abnormal; Notable for the following components:   APPearance CLOUDY (*)    All other components within normal limits  LIPASE, BLOOD  CBC    Radiology: CT ABDOMEN PELVIS W CONTRAST Result Date: 01/06/2025 EXAM: CT ABDOMEN AND PELVIS WITH CONTRAST 01/06/2025 04:53:51 AM TECHNIQUE: CT of the abdomen and pelvis was performed with the administration of 75 mL of iohexol  (OMNIPAQUE ) 350 MG/ML injection. Multiplanar reformatted images are provided for review. Automated exposure control, iterative reconstruction, and/or weight-based adjustment of the mA/kV was utilized to reduce the radiation dose to as low as reasonably achievable. COMPARISON: 08/23/2023 CLINICAL HISTORY: LUQ abdominal pain. FINDINGS: LOWER CHEST: A 5 mm subpleural nodule in the right lung base is unchanged and compatible with a benign process. Unchanged 3 mm nodule in  the posterior right base also compatible with a benign nodule. LIVER: No suspicious liver lesions. GALLBLADDER AND BILE DUCTS: Gallbladder is unremarkable. Mild fusiform dilatation of the common bile duct measures 8 mm. Unchanged from previous exam. SPLEEN: The spleen is within normal limits in size and appearance. PANCREAS: The pancreas is normal in size and contour without focal lesion or ductal dilatation. ADRENAL GLANDS: Normal size and morphology bilaterally. No nodule, thickening, or  hemorrhage. No periadrenal stranding. KIDNEYS, URETERS AND BLADDER: No stones in the kidneys or ureters. No hydronephrosis. No perinephric or periureteral stranding. Urinary bladder is unremarkable. GI AND BOWEL: The stomach appears normal. The appendix is identified within the right lower quadrant. The appendix measures 9 mm in thickness. No significant periappendiceal fat stranding or free fluid. No periappendiceal fluid collection. Mild colonic stool burden. No bowel wall thickening, inflammation or distention. There is no bowel obstruction. Differential diagnosis for a 9 mm appendix without significant periappendiceal inflammation includes normal variant/physiologic distension, early or resolving appendicitis, or appendiceal mucocele. Clinical correlation is paramount. If clinical suspicion for appendicitis remains high, further imaging (e.g., repeat CT in 6-12 hours, MRI) or surgical consultation may be considered. PERITONEUM AND RETROPERITONEUM: No ascites. No free air. VASCULATURE: Aorta is normal in caliber. LYMPH NODES: No lymphadenopathy. REPRODUCTIVE ORGANS: The prostate gland appears normal. BONES AND SOFT TISSUES: No acute osseous abnormality. No focal soft tissue abnormality. IMPRESSION: 1. No acute findings in the abdomen or pelvis related to LUQ abdominal pain. 2. Borderline dilated appendix (9 mm) without periappendiceal inflammation, which can be seen with early/mild appendicitis or a noninflammatory distended/normal appendix; recommend surgical evaluation if clinically suspected, with short-interval repeat imaging (ultrasound or CT) if symptoms persist or worsen. Electronically signed by: Waddell Calk MD 01/06/2025 05:43 AM EST RP Workstation: [id number]     Procedures   Medications Ordered in the ED  bisacodyl  (DULCOLAX) EC tablet 5 mg (has no administration in time range)  iohexol  (OMNIPAQUE ) 350 MG/ML injection 75 mL (75 mLs Intravenous Contrast Given 01/06/25 0454)                                     Medical Decision Making Amount and/or Complexity of Data Reviewed Radiology: ordered.  Risk OTC drugs. Prescription drug management.   Left upper quadrant pain of uncertain cause.  Differential diagnosis includes, but is not limited to, irritable bowel syndrome, inflammatory bowel disease, diverticulitis, cholecystitis, pancreatitis.  I reviewed his laboratory tests, and my interpretation is elevated random glucose level, mildly elevated creatinine and otherwise normal comprehensive metabolic panel, normal lipase, normal CBC, normal urinalysis.  I have reviewed his past records, and creatinine had actually been higher on 08/22/2023 when he was seen in the ED for colitis.  CT abdomen and pelvis at that time showed wall thickening of the transverse and descending colon as well as some pulmonary nodules.  CT scan showed no acute intra-abdominal process.  Appendix was top normal in diameter but without inflammatory changes.  I have independently viewed the images, and agree with the radiologist's interpretation.  Clinical picture is not that of appendicitis.  I have advised patient of these findings, the cause for her pain is not clear but no evidence of serious pathology.  Since she had been complaining of constipation, I have ordered a dose of bisacodyl  and I am discharging him with routine abdominal pain instructions, return if symptoms are worsening.     Final diagnoses:  LUQ abdominal pain  Elevated random blood glucose level  Elevated serum creatinine    ED Discharge Orders     None          Raford Lenis, MD 01/06/25 [phone]  "

## 2025-01-06 NOTE — ED Notes (Signed)
 Patient transported to CT

## 2025-01-06 NOTE — Discharge Instructions (Addendum)
 Your evaluation did not show any serious cause for your pain.  You may take acetaminophen  and/or ibuprofen  as needed for pain.  Return to the emergency department if your symptoms are getting worse.

## 2025-01-06 NOTE — ED Triage Notes (Signed)
 The pt is c/o abd pain for 3 days no bm during this time  he took a laxative but it did not stay down
